# Patient Record
Sex: Male | Born: 1960 | Race: White | Hispanic: No | Marital: Married | Smoking: Former smoker
Health system: Southern US, Community
[De-identification: ages and names within clinical notes are randomized; demographics above are authoritative.]

## PROBLEM LIST (undated history)

## (undated) DIAGNOSIS — F419 Anxiety disorder, unspecified: Secondary | ICD-10-CM

## (undated) DIAGNOSIS — Z9889 Other specified postprocedural states: Secondary | ICD-10-CM

## (undated) DIAGNOSIS — M1990 Unspecified osteoarthritis, unspecified site: Secondary | ICD-10-CM

## (undated) DIAGNOSIS — C439 Malignant melanoma of skin, unspecified: Secondary | ICD-10-CM

## (undated) DIAGNOSIS — T4145XA Adverse effect of unspecified anesthetic, initial encounter: Secondary | ICD-10-CM

## (undated) DIAGNOSIS — Z87898 Personal history of other specified conditions: Secondary | ICD-10-CM

## (undated) DIAGNOSIS — T8859XA Other complications of anesthesia, initial encounter: Secondary | ICD-10-CM

## (undated) DIAGNOSIS — K219 Gastro-esophageal reflux disease without esophagitis: Secondary | ICD-10-CM

## (undated) DIAGNOSIS — Z8603 Personal history of neoplasm of uncertain behavior: Secondary | ICD-10-CM

## (undated) DIAGNOSIS — R569 Unspecified convulsions: Secondary | ICD-10-CM

## (undated) DIAGNOSIS — T7840XA Allergy, unspecified, initial encounter: Secondary | ICD-10-CM

## (undated) HISTORY — DX: Personal history of neoplasm of uncertain behavior: Z86.03

## (undated) HISTORY — DX: Other specified postprocedural states: Z98.890

## (undated) HISTORY — PX: KNEE ARTHROSCOPY: SUR90

## (undated) HISTORY — DX: Allergy, unspecified, initial encounter: T78.40XA

## (undated) HISTORY — DX: Malignant melanoma of skin, unspecified: C43.9

## (undated) HISTORY — DX: Gastro-esophageal reflux disease without esophagitis: K21.9

## (undated) HISTORY — DX: Personal history of other specified conditions: Z87.898

## (undated) HISTORY — DX: Unspecified osteoarthritis, unspecified site: M19.90

## (undated) HISTORY — DX: Anxiety disorder, unspecified: F41.9

## (undated) HISTORY — PX: POLYPECTOMY: SHX149

## (undated) HISTORY — PX: UPPER GASTROINTESTINAL ENDOSCOPY: SHX188

## (undated) HISTORY — PX: COLONOSCOPY: SHX174

---
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
## 1998-07-26 DIAGNOSIS — Z9889 Other specified postprocedural states: Secondary | ICD-10-CM

## 1998-07-26 HISTORY — PX: OTHER SURGICAL HISTORY: SHX169

## 1998-07-26 HISTORY — DX: Other specified postprocedural states: Z98.890

## 2005-08-27 ENCOUNTER — Emergency Department (HOSPITAL_COMMUNITY): Admission: EM | Admit: 2005-08-27 | Discharge: 2005-08-27 | Payer: Self-pay | Admitting: Emergency Medicine

## 2005-08-27 ENCOUNTER — Ambulatory Visit: Payer: Self-pay | Admitting: Internal Medicine

## 2005-08-27 DIAGNOSIS — K222 Esophageal obstruction: Secondary | ICD-10-CM

## 2005-09-15 ENCOUNTER — Ambulatory Visit: Payer: Self-pay | Admitting: Internal Medicine

## 2005-09-20 ENCOUNTER — Encounter (INDEPENDENT_AMBULATORY_CARE_PROVIDER_SITE_OTHER): Payer: Self-pay | Admitting: Specialist

## 2005-09-20 ENCOUNTER — Ambulatory Visit: Payer: Self-pay | Admitting: Internal Medicine

## 2005-09-22 ENCOUNTER — Ambulatory Visit (HOSPITAL_COMMUNITY): Admission: RE | Admit: 2005-09-22 | Discharge: 2005-09-22 | Payer: Self-pay | Admitting: Internal Medicine

## 2007-06-12 ENCOUNTER — Ambulatory Visit: Payer: Self-pay | Admitting: Internal Medicine

## 2007-07-27 DIAGNOSIS — C439 Malignant melanoma of skin, unspecified: Secondary | ICD-10-CM

## 2007-07-27 HISTORY — PX: OTHER SURGICAL HISTORY: SHX169

## 2007-07-27 HISTORY — DX: Malignant melanoma of skin, unspecified: C43.9

## 2007-08-28 ENCOUNTER — Ambulatory Visit (HOSPITAL_COMMUNITY): Admission: RE | Admit: 2007-08-28 | Discharge: 2007-08-28 | Payer: Self-pay | Admitting: General Surgery

## 2007-09-08 DIAGNOSIS — K219 Gastro-esophageal reflux disease without esophagitis: Secondary | ICD-10-CM

## 2007-09-18 ENCOUNTER — Ambulatory Visit (HOSPITAL_BASED_OUTPATIENT_CLINIC_OR_DEPARTMENT_OTHER): Admission: RE | Admit: 2007-09-18 | Discharge: 2007-09-18 | Payer: Self-pay | Admitting: General Surgery

## 2007-09-18 ENCOUNTER — Encounter (INDEPENDENT_AMBULATORY_CARE_PROVIDER_SITE_OTHER): Payer: Self-pay | Admitting: General Surgery

## 2007-10-09 ENCOUNTER — Ambulatory Visit: Payer: Self-pay | Admitting: Hematology and Oncology

## 2007-10-11 ENCOUNTER — Ambulatory Visit (HOSPITAL_COMMUNITY): Admission: RE | Admit: 2007-10-11 | Discharge: 2007-10-11 | Payer: Self-pay | Admitting: Hematology and Oncology

## 2007-10-11 LAB — CBC WITH DIFFERENTIAL/PLATELET
EOS%: 0.7 % (ref 0.0–7.0)
MCH: 31.1 pg (ref 28.0–33.4)
MCHC: 35.4 g/dL (ref 32.0–35.9)
MCV: 87.8 fL (ref 81.6–98.0)
MONO%: 7.5 % (ref 0.0–13.0)
RBC: 4.75 10*6/uL (ref 4.20–5.71)
RDW: 12.1 % (ref 11.2–14.6)

## 2007-10-11 LAB — COMPREHENSIVE METABOLIC PANEL
AST: 21 U/L (ref 0–37)
Albumin: 4.5 g/dL (ref 3.5–5.2)
Alkaline Phosphatase: 52 U/L (ref 39–117)
Potassium: 4 mEq/L (ref 3.5–5.3)
Sodium: 141 mEq/L (ref 135–145)
Total Protein: 6.9 g/dL (ref 6.0–8.3)

## 2007-10-24 ENCOUNTER — Ambulatory Visit (HOSPITAL_COMMUNITY): Admission: RE | Admit: 2007-10-24 | Discharge: 2007-10-24 | Payer: Self-pay | Admitting: Hematology and Oncology

## 2008-09-10 ENCOUNTER — Telehealth: Payer: Self-pay | Admitting: Internal Medicine

## 2008-10-09 ENCOUNTER — Telehealth: Payer: Self-pay | Admitting: Internal Medicine

## 2008-11-08 ENCOUNTER — Ambulatory Visit: Payer: Self-pay | Admitting: Internal Medicine

## 2008-12-10 ENCOUNTER — Telehealth: Payer: Self-pay | Admitting: Internal Medicine

## 2009-09-08 ENCOUNTER — Encounter: Payer: Self-pay | Admitting: Internal Medicine

## 2010-06-25 ENCOUNTER — Encounter (HOSPITAL_COMMUNITY)
Admission: RE | Admit: 2010-06-25 | Discharge: 2010-08-25 | Payer: Self-pay | Source: Home / Self Care | Attending: Family Medicine | Admitting: Family Medicine

## 2010-08-27 NOTE — Medication Information (Signed)
Summary: Approved/medco  Approved/medco   Imported By: Lester Gardena 09/12/2009 10:00:38  _____________________________________________________________________  External Attachment:    Type:   Image     Comment:   External Document

## 2010-12-08 NOTE — Op Note (Signed)
NAMEJOE, GEE               ACCOUNT NO.:  [id number]   MEDICAL RECORD NO.:  [id number]          PATIENT TYPE:  AMB   LOCATION:  DSC                          FACILITY:  [facility]   PHYSICIAN:  Gabrielle Dare. Janee Morn, M.D.DATE OF BIRTH:  1960-08-05   DATE OF PROCEDURE:  09/18/2007  DATE OF DISCHARGE:  09/18/2007                               OPERATIVE REPORT   PREOPERATIVE DIAGNOSIS:  Melanoma left flank.   POSTOPERATIVE DIAGNOSIS:  Melanoma left flank.   PROCEDURES:  1. Left axillary sentinel lymph node biopsy with blue dye injection.  2. Wide excision melanoma left flank 5 x 12 cm with layered closure.   SURGEON:  Gabrielle Dare. Janee Morn, M.D.   HISTORY OF PRESENT ILLNESS:  Mr. Brittian is a 50 year old gentleman who I  evaluated in the office in regards to a melanoma on his left flank.  Biopsy demonstrated thickness of at least 0.60 mm, however, deep margin  was focally involved by invasive disease.  Therefore, he underwent  preoperative lymph scintigram which demonstrated a sentinel node in his  left axilla.  We are proceeding with left axillary sentinel lymph node  biopsy and then wide excision of his melanoma on his left flank.   PROCEDURE IN DETAIL:  Informed consent was obtained.  The patient's site  was identified.  He received intravenous antibiotics.  He was brought to  the operating room.  General endotracheal anesthesia was administered by  the anesthesia staff.  There was 4 mL of diluted methylene blue injected  cutaneously around his melanoma site and massaged for 4 minutes.  Left  axilla was prepped and draped in sterile fashion.  NeoProbe was used  locating a high signal area in the central lower axilla.  Transverse  incision was made.  Subcutaneous tissues were dissected down entering  the axillary fat.  We stayed away from the long thoracic and  thoracodorsal nerves.  We easily found a blue lymph node with high  signal on the NeoProbe about 580.  This was circumferentially  dissected.  Hemostasis was obtained with Bovie cautery and the lymph node was sent  to pathology as hot blue node.  There may have been other smaller node  next to it that was sent together as one specimen NeoProbe was then used  to explore the wound thoroughly and no other elevated signal was found  whatsoever.  The wound was irrigated.  Marcaine 25% with epinephrine was  injected.  Hemostasis was ensured.  Subcutaneous tissues were  approximated with interrupted 3-0 Vicryl sutures and the skin was closed  with running 4-0 Monocryl subcuticular stitch.  Dermabond was then  applied.  At this point, the patient was turned into prone position.  His back and left flank were prepped and draped in sterile fashion.  An  area around his old melanoma biopsy site was measured out, giving at  least 1.5 cm of circumferential margin.  This made a ellipse along  tissue planes approximately 5 x 12 cm.  The area was infiltrated with  25% Marcaine.  Elliptical incision was made encompassing this wide area  of excision.  The subcutaneous tissues were dissected all the way down  to the underlying fascia and the wide excision was taken off in one  piece.  It was oriented with 2-0 silk for pathology.  The area was  copiously irrigated.  Meticulous hemostasis was ensured.  Subcutaneous  tissues were approximated with interrupted 2-0 Vicryl sutures and the  skin was closed with interrupted 3-0 nylon  cutaneous sutures.  It came together nicely without significant tension  whatsoever.  Sponge, needle and instrument counts were all correct.  A  bulky gauze sterile dressing was applied.  The patient tolerated  procedure well without apparent complication and was taken to the  recovery room in stable condition.      Gabrielle Dare Janee Morn, M.D.  Electronically Signed     BET/MEDQ  D:  09/18/2007  T:  09/18/2007  Job:  [id number]   cc:   L. Lupe Carney, M.D.

## 2010-12-08 NOTE — Assessment & Plan Note (Signed)
[facility]                         GASTROENTEROLOGY OFFICE NOTE   NAME:GLYNNDaequan, Kozma                        MRN:          [id number]  DATE:06/12/2007                            DOB:          [birth year]    PROGRESS NOTE:  Mr. Magnussen is a very nice 51 year old gentleman who has a  history of benign distal esophageal stricture, status post food  impaction in his esophagus in February of 2007 requiring emergency  disimpaction.  Repeat upper endoscopy 2 weeks later showed mild  esophageal stricture which was dilated with several dilators, 16-17 mm  with complete resolution of his dysphagia.  He was initially on  Protonix, but subsequently on Prilosec 200 mg daily.  He has been  noticing some dysphagia, but not severe.  He also has heartburn  substernally and sometimes pain with swallowing.  He does not smoke,  does not drink alcohol, does not take any NSAIDs.   PHYSICAL EXAMINATION:  VITAL SIGNS:  Blood pressure 96/68, pulse 56 and  weight 214 pounds.  GENERAL:  He was alert, oriented, in no distress.  The patient was not  examined today.   IMPRESSION:  A 50 year old white male who has a history of benign distal  esophageal stricture, status post dilatation on September 20, 2005, and  esophageal biopsies did not show evidence of Barrett's esophagus.  He is  having breakthrough symptoms at this time on Prilosec 20 mg daily.   PLAN:  1. Increase Prilosec to 40 mg daily.  2. The patient will notify us when he feels he needs repeat      dilatation.  3. Anti-reflux measures.  4. I will see him in 1 year or earlier if he needs to see [place].     Hedwig Morton. Juanda Chance, MD  Electronically Signed    DMB/MedQ  DD: 06/12/2007  DT: 06/13/2007  Job #: [id number]   cc:   L. Lupe Carney, M.D.

## 2011-09-08 ENCOUNTER — Encounter: Payer: Self-pay | Admitting: Internal Medicine

## 2011-10-06 ENCOUNTER — Other Ambulatory Visit: Payer: Self-pay | Admitting: Dermatology

## 2012-02-04 ENCOUNTER — Encounter: Payer: Self-pay | Admitting: Internal Medicine

## 2012-03-17 ENCOUNTER — Ambulatory Visit (AMBULATORY_SURGERY_CENTER): Payer: 59 | Admitting: *Deleted

## 2012-03-17 ENCOUNTER — Telehealth: Payer: Self-pay | Admitting: *Deleted

## 2012-03-17 ENCOUNTER — Encounter: Payer: Self-pay | Admitting: Internal Medicine

## 2012-03-17 VITALS — Ht 71.0 in | Wt 212.3 lb

## 2012-03-17 DIAGNOSIS — Z1211 Encounter for screening for malignant neoplasm of colon: Secondary | ICD-10-CM

## 2012-03-17 MED ORDER — MOVIPREP 100 G PO SOLR: ORAL | Status: DC

## 2012-03-17 NOTE — Telephone Encounter (Signed)
Pt said that during his last surgery in 2009; he was hard to intubate.  He was told that if he ever was intubated again; he should inform anesthetist.  I talked with John Nulty, CRNA and he says pt should have procedure at the hospital instead of [facility].  Left message on pt's voice mail that he would need to have procedure at hospital.  Also left message that colonoscopy for 9/6 will be cancelled and our office would call him to schedule procedure at hospital.  

## 2012-03-17 NOTE — Progress Notes (Signed)
Pt said that during his last surgery in 2009; he was hard to intubate.  He was told that if he ever was intubated again; he should inform anesthetist.  I talked with Cathlyn Parsons, CRNA and he says pt should have procedure at the hospital instead of [facility].  Left message on pt's voice mail that he would need to have procedure at hospital.  Also left message that colonoscopy for 9/6 will be cancelled and our office would call him to schedule procedure at hospital.

## 2012-03-20 NOTE — Telephone Encounter (Signed)
Patient is difficult to intubate per patient. Procedure needs to be done at hospital. Do you want it on your hospital week?

## 2012-03-24 ENCOUNTER — Other Ambulatory Visit: Payer: Self-pay | Admitting: Internal Medicine

## 2012-03-24 DIAGNOSIS — Z1211 Encounter for screening for malignant neoplasm of colon: Secondary | ICD-10-CM

## 2012-03-24 NOTE — Telephone Encounter (Signed)
OK to schedule for the hospital week. No Propofol, just Versed/Fentanyl

## 2012-03-24 NOTE — Telephone Encounter (Signed)
Left message for patient to call back. I have scheduled patient for hospital colonoscopy with fentanyl and versed on 05/17/12 @ 8:30 am.

## 2012-03-28 NOTE — Telephone Encounter (Signed)
Gave patient information regarding time, date and location of his scheduled colonoscopy procedure at the hospital. He verbalizes understanding and will adjust instructions given to him on the day of his previsit.

## 2012-03-28 NOTE — Telephone Encounter (Signed)
Left message for patient to call back  

## 2012-03-31 ENCOUNTER — Encounter: Payer: Self-pay | Admitting: Internal Medicine

## 2012-05-17 ENCOUNTER — Ambulatory Visit (HOSPITAL_COMMUNITY)
Admission: RE | Admit: 2012-05-17 | Discharge: 2012-05-17 | Disposition: A | Discharge status: Home / Self Care | Payer: 59 | Source: Ambulatory Visit | Attending: Internal Medicine | Admitting: Internal Medicine

## 2012-05-17 ENCOUNTER — Encounter (HOSPITAL_COMMUNITY)
Admission: RE | Disposition: A | Discharge status: Home / Self Care | Payer: Self-pay | Source: Ambulatory Visit | Attending: Internal Medicine

## 2012-05-17 ENCOUNTER — Encounter (HOSPITAL_COMMUNITY): Payer: Self-pay | Admitting: *Deleted

## 2012-05-17 DIAGNOSIS — D126 Benign neoplasm of colon, unspecified: Secondary | ICD-10-CM | POA: Insufficient documentation

## 2012-05-17 DIAGNOSIS — K219 Gastro-esophageal reflux disease without esophagitis: Secondary | ICD-10-CM | POA: Insufficient documentation

## 2012-05-17 DIAGNOSIS — Z1211 Encounter for screening for malignant neoplasm of colon: Secondary | ICD-10-CM | POA: Insufficient documentation

## 2012-05-17 DIAGNOSIS — Z85820 Personal history of malignant melanoma of skin: Secondary | ICD-10-CM | POA: Insufficient documentation

## 2012-05-17 HISTORY — DX: Other complications of anesthesia, initial encounter: T88.59XA

## 2012-05-17 HISTORY — DX: Adverse effect of unspecified anesthetic, initial encounter: T41.45XA

## 2012-05-17 HISTORY — DX: Unspecified convulsions: R56.9

## 2012-05-17 HISTORY — PX: COLONOSCOPY: SHX5424

## 2012-05-17 SURGERY — COLONOSCOPY
Anesthesia: Moderate Sedation

## 2012-05-17 MED ORDER — FENTANYL CITRATE 0.05 MG/ML IJ SOLN
INTRAMUSCULAR | Status: DC | PRN
  Administered 2012-05-17 (×4): 25 ug via INTRAVENOUS

## 2012-05-17 MED ORDER — DIPHENHYDRAMINE HCL 50 MG/ML IJ SOLN
INTRAMUSCULAR | Status: AC
  Filled 2012-05-17: qty 1

## 2012-05-17 MED ORDER — SODIUM CHLORIDE 0.9 % IV SOLN
INTRAVENOUS | Status: DC
  Administered 2012-05-17: 08:00:00 via INTRAVENOUS

## 2012-05-17 MED ORDER — MIDAZOLAM HCL 10 MG/2ML IJ SOLN
INTRAMUSCULAR | Status: AC
  Filled 2012-05-17: qty 2

## 2012-05-17 MED ORDER — MIDAZOLAM HCL 5 MG/5ML IJ SOLN
INTRAMUSCULAR | Status: DC | PRN
  Administered 2012-05-17 (×4): 2 mg via INTRAVENOUS

## 2012-05-17 MED ORDER — FENTANYL CITRATE 0.05 MG/ML IJ SOLN
INTRAMUSCULAR | Status: AC
  Filled 2012-05-17: qty 2

## 2012-05-17 NOTE — H&P (Signed)
  Rajon Bisig [birth year] MRN [id number]        History of Present Illness:  This is a 51 yo male scheduled for screening colonoscopy. He has a hx of melanoma. We have seen him in the past for GERD- EGD  08/2007- es. Stricture , dilated to 17 mm.   Past Medical History  Diagnosis Date  . Arthritis     knee, lower back  . Melanoma 2009    back  . 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 GERD (gastroesophageal reflux disease)   . History of seizures     not since 2000; does not require meds   Past Surgical History  Procedure Date  . Removal brain tumor 2000  . Knee arthroscopy 1977, 1979, 1995    left  . Melanoma surgery 2009    lower back  . Tonsillectomy 1967    reports that he quit smoking about 18 years ago. He has never used smokeless tobacco. He reports that he drinks about 5.4 ounces of alcohol per week. He reports that he does not use illicit drugs. family history includes Colon polyps in his mother.  There is no history of Colon cancer and Stomach cancer. No Known Allergies      Review of Systems:  The remainder of the 10 point ROS is negative except as outlined in H&P   Physical Exam: General appearance  Well developed, in no distress. Eyes- non icteric. HEENT nontraumatic, normocephalic. Mouth no lesions, tongue papillated, no cheilosis. Neck supple without adenopathy, thyroid not enlarged, no carotid bruits, no JVD. Lungs Clear to auscultation bilaterally. Cor normal S1, normal S2, regular rhythm, no murmur,  quiet precordium. Abdomen: soft, non tender Rectal:not done Extremities no pedal edema. Skin no lesions. Neurological alert and oriented x 3. Psychological normal mood and affect.  Assessment and Plan:  51 yo male  With no risk factors for colon cancer, will undergo screening colonoscopy   05/17/2012 Lina Sar

## 2012-05-17 NOTE — Op Note (Addendum)
[facility] [address] [place], [address]   COLONOSCOPY PROCEDURE REPORT  PATIENT: Frederick Hamilton, Frederick Hamilton  MR#: [id number] BIRTHDATE: 08-01-1960 , 51  yrs. old GENDER: Male ENDOSCOPIST: Hart Carwin, MD REFERRED BY:  Lupe Carney, M.D. ,Dr Dalene Carrow PROCEDURE DATE:  05/17/2012 PROCEDURE:   Colonoscopy with cold biopsy polypectomy ASA CLASS:   Class II INDICATIONS:average risk patient for colon cancer and hx of melanoma. MEDICATIONS: These medications were titrated to patient response per physician's verbal order, Fentanyl-Detailed 100 mcg IV, and Versed-Detailed 8 mg IV  DESCRIPTION OF PROCEDURE:   After the risks and benefits and of the procedure were explained, informed consent was obtained.  A digital rectal exam revealed no abnormalities of the rectum.    The Pentax Ped Colon [id number]  endoscope was introduced through the anus and advanced to the cecum, which was identified by both the appendix and ileocecal valve .  The quality of the prep was good, using MoviPrep .  The instrument was then slowly withdrawn as the colon was fully examined.     COLON FINDINGS: A smooth sessile polyp ranging between 3-54mm in size was found in the ascending colon.  A polypectomy was performed with cold forceps.  The resection was complete and the polyp tissue was completely retrieved.     Retroflexed views revealed no abnormalities.     The scope was then withdrawn from the patient and the procedure completed.  COMPLICATIONS: There were no complications. ENDOSCOPIC IMPRESSION: Sessile polyp ranging between 3-36mm in size was found in the ascending colon; polypectomy was performed with cold forceps  RECOMMENDATIONS: Await pathology results   REPEAT EXAM: for Colonoscopy. in 5-10 years pending polyp results  cc:  _______________________________ eSigned:  Hart Carwin, MD 05/17/2012 9:12 AM

## 2012-05-17 NOTE — Interval H&P Note (Signed)
History and Physical Interval Note:  05/17/2012 7:05 AM  Frederick Hamilton  has presented today for surgery, with the diagnosis of screening colonoscopy  The various methods of treatment have been discussed with the patient and family. After consideration of risks, benefits and other options for treatment, the patient has consented to  Procedure(s) (LRB) with comments: COLONOSCOPY (N/A) as a surgical intervention .  The patient's history has been reviewed, patient examined, no change in status, stable for surgery.  I have reviewed the patient's chart and labs.  Questions were answered to the patient's satisfaction.     Lina Sar

## 2012-05-18 ENCOUNTER — Encounter (HOSPITAL_COMMUNITY): Payer: Self-pay | Admitting: Internal Medicine

## 2012-05-18 ENCOUNTER — Encounter (HOSPITAL_COMMUNITY): Payer: Self-pay

## 2012-05-20 ENCOUNTER — Encounter: Payer: Self-pay | Admitting: Internal Medicine

## 2012-11-15 ENCOUNTER — Other Ambulatory Visit: Payer: Self-pay | Admitting: Dermatology

## 2013-06-16 ENCOUNTER — Telehealth: Payer: Self-pay | Admitting: Family Medicine

## 2013-06-16 NOTE — Telephone Encounter (Signed)
Pt came in today to receive the flu shot °

## 2013-11-12 ENCOUNTER — Other Ambulatory Visit: Payer: Self-pay | Admitting: Dermatology

## 2013-12-18 ENCOUNTER — Other Ambulatory Visit: Payer: Self-pay | Admitting: Dermatology

## 2015-08-21 ENCOUNTER — Other Ambulatory Visit: Payer: Self-pay | Admitting: Oral Surgery

## 2016-10-07 DIAGNOSIS — H40023 Open angle with borderline findings, high risk, bilateral: Secondary | ICD-10-CM | POA: Diagnosis not present

## 2016-12-27 DIAGNOSIS — H40013 Open angle with borderline findings, low risk, bilateral: Secondary | ICD-10-CM | POA: Diagnosis not present

## 2017-02-18 DIAGNOSIS — Z125 Encounter for screening for malignant neoplasm of prostate: Secondary | ICD-10-CM | POA: Diagnosis not present

## 2017-02-18 DIAGNOSIS — R5383 Other fatigue: Secondary | ICD-10-CM | POA: Diagnosis not present

## 2017-02-18 DIAGNOSIS — J309 Allergic rhinitis, unspecified: Secondary | ICD-10-CM | POA: Diagnosis not present

## 2017-02-18 DIAGNOSIS — K219 Gastro-esophageal reflux disease without esophagitis: Secondary | ICD-10-CM | POA: Diagnosis not present

## 2017-03-18 DIAGNOSIS — L820 Inflamed seborrheic keratosis: Secondary | ICD-10-CM | POA: Diagnosis not present

## 2017-03-18 DIAGNOSIS — L821 Other seborrheic keratosis: Secondary | ICD-10-CM | POA: Diagnosis not present

## 2017-03-18 DIAGNOSIS — D485 Neoplasm of uncertain behavior of skin: Secondary | ICD-10-CM | POA: Diagnosis not present

## 2017-03-18 DIAGNOSIS — Z85820 Personal history of malignant melanoma of skin: Secondary | ICD-10-CM | POA: Diagnosis not present

## 2017-03-18 DIAGNOSIS — D2261 Melanocytic nevi of right upper limb, including shoulder: Secondary | ICD-10-CM | POA: Diagnosis not present

## 2017-05-26 ENCOUNTER — Encounter: Payer: Self-pay | Admitting: Gastroenterology

## 2017-06-14 ENCOUNTER — Encounter: Payer: Self-pay | Admitting: Gastroenterology

## 2017-07-06 DIAGNOSIS — H40013 Open angle with borderline findings, low risk, bilateral: Secondary | ICD-10-CM | POA: Diagnosis not present

## 2017-07-13 DIAGNOSIS — R6889 Other general symptoms and signs: Secondary | ICD-10-CM | POA: Diagnosis not present

## 2017-07-13 DIAGNOSIS — R5383 Other fatigue: Secondary | ICD-10-CM | POA: Diagnosis not present

## 2017-07-14 ENCOUNTER — Other Ambulatory Visit: Payer: Self-pay | Admitting: Family Medicine

## 2017-07-14 DIAGNOSIS — R6889 Other general symptoms and signs: Secondary | ICD-10-CM

## 2017-07-29 ENCOUNTER — Ambulatory Visit
Admission: RE | Admit: 2017-07-29 | Discharge: 2017-07-29 | Disposition: A | Discharge status: Home / Self Care | Payer: Commercial Managed Care - PPO | Source: Ambulatory Visit | Attending: Family Medicine | Admitting: Family Medicine

## 2017-07-29 DIAGNOSIS — R413 Other amnesia: Secondary | ICD-10-CM | POA: Diagnosis not present

## 2017-07-29 DIAGNOSIS — R6889 Other general symptoms and signs: Secondary | ICD-10-CM

## 2017-07-29 MED ORDER — GADOBENATE DIMEGLUMINE 529 MG/ML IV SOLN
19.0000 mL | Freq: Once | INTRAVENOUS | Status: AC | PRN
  Administered 2017-07-29: 19 mL via INTRAVENOUS

## 2017-08-03 ENCOUNTER — Ambulatory Visit (AMBULATORY_SURGERY_CENTER): Payer: Self-pay | Admitting: *Deleted

## 2017-08-03 ENCOUNTER — Telehealth: Payer: Self-pay | Admitting: *Deleted

## 2017-08-03 ENCOUNTER — Other Ambulatory Visit: Payer: Self-pay

## 2017-08-03 VITALS — Ht 72.0 in | Wt 230.0 lb

## 2017-08-03 DIAGNOSIS — T884XXA Failed or difficult intubation, initial encounter: Secondary | ICD-10-CM | POA: Insufficient documentation

## 2017-08-03 DIAGNOSIS — Z86010 Personal history of colonic polyps: Secondary | ICD-10-CM

## 2017-08-03 MED ORDER — NA SULFATE-K SULFATE-MG SULF 17.5-3.13-1.6 GM/177ML PO SOLN: 1.0000 | Freq: Once | ORAL | 0 refills | Status: AC

## 2017-08-03 NOTE — Telephone Encounter (Signed)
Dr Loletha Carrow,  This pt was seen in [place] today- he has a hx of difficult intubation- he did receive a letter stating this- his last colon was done at [facility] due to this issue.  He also told me that he has had a change in bowel habits - he has gone from daily regular stools to liquidy frequent stools. He has had some additions/ changes  to his medicines recently.  Do you want him to have an OV or can he be direct to [facility]??  I did complete his PV today , but informed him he will not be able to have his procedure in the [facility]- I cancelled the [place] colon and explained I will call him with your decision.  Thanks for your time,  Lelan Pons

## 2017-08-03 NOTE — Progress Notes (Signed)
No egg or soy allergy known to patient  No issues with past sedation with any surgeries  or procedures, difficult  Intubation- pt was given a letter for this- will complete PV- give blank instructions and informed pt will get call from nurse with new date/ time  No diet pills per patient No home 02 use per patient  No blood thinners per patient  Pt denies issues with constipation  No A fib or A flutter  EMMI video sent to pt's e mail  Pt states he has had a change in bowel movements- having loose liquid stools daily- he has had some medicine changes and questions if this the cause- TE to Dr Loletha Carrow about intubation and BM changes - informed pt may have to have an OV

## 2017-08-04 NOTE — Telephone Encounter (Signed)
Sounds like he should come see me in clinic regarding the bowel habits.

## 2017-08-04 NOTE — Telephone Encounter (Signed)
Spoke with pt- sch OV with Dr Loletha Carrow with pt- for 2-8 Friday 4-pm- pt states needs late appt  Lelan Pons PV

## 2017-08-04 NOTE — Telephone Encounter (Signed)
Attempted to reach pt- left message on cell number that identifies pt by first and last name to return my call   Frederick Hamilton PV

## 2017-08-17 ENCOUNTER — Encounter: Payer: Self-pay | Admitting: Gastroenterology

## 2017-09-02 ENCOUNTER — Encounter: Payer: Self-pay | Admitting: Gastroenterology

## 2017-09-02 ENCOUNTER — Ambulatory Visit: Payer: Commercial Managed Care - PPO | Admitting: Gastroenterology

## 2017-09-02 VITALS — BP 120/70 | HR 84 | Ht 71.0 in | Wt 233.0 lb

## 2017-09-02 DIAGNOSIS — R194 Change in bowel habit: Secondary | ICD-10-CM | POA: Diagnosis not present

## 2017-09-02 DIAGNOSIS — Z86010 Personal history of colonic polyps: Secondary | ICD-10-CM

## 2017-09-02 MED ORDER — PEG-KCL-NACL-NASULF-NA ASC-C 140 G PO SOLR: 140.0000 g | ORAL | 0 refills | Status: AC

## 2017-09-02 NOTE — Progress Notes (Signed)
[facility] Consult Note:  History: Frederick Hamilton 09/02/2017  Referring physician: Alroy Dust, Carlean Jews.Marlou Sa, MD  Reason for consult/chief complaint: change in bowels (watery bowel movements to pasty bowel movements; typically has hard bowel movements; has been occuring for over 1 year) and history difficult intubation   Subjective  HPI:  This is a 57 year old man last seen by Dr. Olevia Perches for a routine colonoscopy in October 2013, a subcentimeter tubular adenoma was removed.  He was recalled for a colonoscopy, but cannot be done in our endoscopy lab because of a history of difficult intubation from a prior surgery.  He was also describing change in bowel habits and thus was brought for an office visit. For the last year or 2 he has gone from a usual bowel pattern of once a week to 1 every day to every other day, but it is sometimes pasty and occasionally loose.  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 [name] is not certain if he had any new meds when this problem started, but perhaps Prozac was instituted at that time after being changed from previous Zoloft.  He has been on Prilosec for many years, long before the change in bowel habits.  ROS:  Review of Systems  Constitutional: Negative for appetite change and unexpected weight change.  HENT: Negative for mouth sores and voice change.   Eyes: Negative for pain and redness.  Respiratory: Negative for cough and shortness of breath.   Cardiovascular: Negative for chest pain and palpitations.  Genitourinary: Negative for dysuria and hematuria.  Musculoskeletal: Negative for arthralgias and myalgias.  Skin: Negative for pallor and rash.  Neurological: Negative for weakness and headaches.  Hematological: Negative for adenopathy.  Psychiatric/Behavioral: The patient is nervous/anxious.    He reports his anxiety is well controlled with Prozac  Past  Medical History: Past Medical History:  Diagnosis Date  . Allergy   . Anxiety   . Arthritis    knee, lower back  . Complication of anesthesia   . Difficult intubation   . GERD (gastroesophageal reflux disease)   . History of seizures    not since 2000; does not require meds  . 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 Melanoma ([place]) 2009   back  . Seizures ([place])    last was feb 2000 at [facility]     Past Surgical History: Past Surgical History:  Procedure Laterality Date  . COLONOSCOPY  05/17/2012   Procedure: COLONOSCOPY;  Surgeon: Lafayette Dragon, MD;  Location: [facility] ENDOSCOPY;  Service: Endoscopy;  Laterality: N/A;  . COLONOSCOPY    . KNEE ARTHROSCOPY  1977, 1979, 1995   left  . melanoma surgery  2009   lower back  . POLYPECTOMY    . removal brain tumor  2000  . TONSILLECTOMY  1967  . UPPER GASTROINTESTINAL ENDOSCOPY       Family History: Family History  Problem Relation Age of Onset  . Colon polyps Mother   . Irritable bowel syndrome Mother   . Cancer - Other Father        initially had ureter cancer; then had cancer of kidney  . Lung cancer Father   . Colon cancer Neg Hx   . Stomach cancer Neg Hx   . Rectal cancer Neg Hx     Social History: Social History   Socioeconomic History  . Marital status:  Married    Spouse name: None  . Number of children: None  . Years of education: None  . Highest education level: None  Social Needs  . Financial resource strain: None  . Food insecurity - worry: None  . Food insecurity - inability: None  . Transportation needs - medical: None  . Transportation needs - non-medical: None  Occupational History  . None  Tobacco Use  . Smoking status: Former Smoker    Last attempt to quit: 03/17/1994    Years since quitting: 23.4  . Smokeless tobacco: Never Used  Substance and Sexual Activity  . Alcohol use: Yes    Alcohol/week: 5.4 oz    Types: 9 Cans of beer per week    Comment: 6 pack  a week   . Drug use: No  . Sexual activity: None  Other Topics Concern  . None  Social History Narrative  . None    Allergies: No Known Allergies  Outpatient Meds: Current Outpatient Medications  Medication Sig Dispense Refill  . ALLEGRA-D ALLERGY & CONGESTION 60-120 MG 12 hr tablet Take 1 tablet by mouth 2 (two) times daily.  6  . buPROPion (WELLBUTRIN XL) 150 MG 24 hr tablet Take 150 mg by mouth every morning.  12  . etodolac (LODINE) 400 MG tablet Take 400 mg by mouth as needed.     Marland Kitchen FLUoxetine (PROZAC) 20 MG capsule Take 2 capsules by mouth once daily  5  . fluticasone (FLONASE) 50 MCG/ACT nasal spray Place 2 sprays into both nostrils daily.  0  . Multiple Vitamin (MULTIVITAMIN) tablet Take 1 tablet by mouth daily.    Marland Kitchen omeprazole (PRILOSEC) 40 MG capsule Take 40 mg by mouth daily.     . tamsulosin (FLOMAX) 0.4 MG CAPS capsule 1 CAPSULE 30 MINUTES AFTER THE SAME MEAL EACH DAY ORALLY  12  . zolpidem (AMBIEN) 10 MG tablet Take 10 mg by mouth at bedtime as needed.     Marland Kitchen PEG-KCl-NaCl-NaSulf-Na Asc-C (PLENVU) 140 g SOLR Take 140 g by mouth as directed. 1 each 0   No current facility-administered medications for this visit.       ___________________________________________________________________ Objective   Exam:  BP 120/70   Pulse 84   Ht 5\' 11"  (1.803 m)   Wt 233 lb (105.7 kg)   BMI 32.50 kg/m    General: this is a(n) well-appearing man  Eyes: sclera anicteric, no redness  ENT: oral mucosa moist without lesions, no cervical or supraclavicular lymphadenopathy, good dentition  CV: RRR without murmur, S1/S2, no JVD, no peripheral edema  Resp: clear to auscultation bilaterally, normal RR and effort noted  GI: soft, no tenderness, with active bowel sounds. No guarding or palpable organomegaly noted.  Skin; warm and dry, no rash or jaundice noted   Assessment: Encounter Diagnoses  Name Primary?  . Personal history of colonic polyps Yes  . Change in bowel  habits    Because of altered bowel habits unclear, but sounds likely to be benign.   Plan:  I advised daily fiber supplement, and he is scheduled for surveillance colonoscopy in the hospital endoscopy lab.  The benefits and risks of the planned procedure were described in detail with the patient or (when appropriate) their health care proxy.  Risks were outlined as including, but not limited to, bleeding, infection, perforation, adverse medication reaction leading to cardiac or pulmonary decompensation, or pancreatitis (if ERCP).  The limitation of incomplete mucosal visualization was also discussed.  No guarantees or warranties  were given.   Thank you for the courtesy of this consult.  Please call me with any questions or concerns.  [name]  CC: Alroy Dust, L.Marlou Sa, MD

## 2017-09-02 NOTE — Patient Instructions (Signed)
If you are age 56 or older, your body mass index should be between 23-30. Your Body mass index is 32.5 kg/m. If this is out of the aforementioned range listed, please consider follow up with your Primary Care Provider.  If you are age 66 or younger, your body mass index should be between 19-25. Your Body mass index is 32.5 kg/m. If this is out of the aformentioned range listed, please consider follow up with your Primary Care Provider.   You have been scheduled for a colonoscopy. Please follow written instructions given to you at your visit today.  Please pick up your prep supplies at the pharmacy within the next 1-3 days. If you use inhalers (even only as needed), please bring them with you on the day of your procedure. Your physician has requested that you go to www.startemmi.com and enter the access code given to you at your visit today. This web site gives a general overview about your procedure. However, you should still follow specific instructions given to you by our office regarding your preparation for the procedure.  Thank you for choosing [facility]  Dr Wilfrid Lund III

## 2017-09-06 DIAGNOSIS — R0681 Apnea, not elsewhere classified: Secondary | ICD-10-CM | POA: Diagnosis not present

## 2017-10-21 ENCOUNTER — Other Ambulatory Visit: Payer: Self-pay

## 2017-10-21 ENCOUNTER — Encounter (HOSPITAL_COMMUNITY): Payer: Self-pay | Admitting: *Deleted

## 2017-10-28 ENCOUNTER — Other Ambulatory Visit: Payer: Self-pay

## 2017-11-01 ENCOUNTER — Ambulatory Visit (HOSPITAL_COMMUNITY)
Admission: RE | Admit: 2017-11-01 | Discharge: 2017-11-01 | Disposition: A | Discharge status: Home / Self Care | Payer: Commercial Managed Care - PPO | Source: Ambulatory Visit | Attending: Gastroenterology | Admitting: Gastroenterology

## 2017-11-01 ENCOUNTER — Encounter (HOSPITAL_COMMUNITY): Payer: Self-pay

## 2017-11-01 ENCOUNTER — Ambulatory Visit (HOSPITAL_COMMUNITY): Payer: Commercial Managed Care - PPO | Admitting: Certified Registered Nurse Anesthetist

## 2017-11-01 ENCOUNTER — Other Ambulatory Visit: Payer: Self-pay

## 2017-11-01 ENCOUNTER — Encounter (HOSPITAL_COMMUNITY)
Admission: RE | Disposition: A | Discharge status: Home / Self Care | Payer: Self-pay | Source: Ambulatory Visit | Attending: Gastroenterology

## 2017-11-01 DIAGNOSIS — Z860100 Personal history of colon polyps, unspecified: Secondary | ICD-10-CM

## 2017-11-01 DIAGNOSIS — Z1211 Encounter for screening for malignant neoplasm of colon: Secondary | ICD-10-CM | POA: Diagnosis not present

## 2017-11-01 DIAGNOSIS — K222 Esophageal obstruction: Secondary | ICD-10-CM | POA: Diagnosis not present

## 2017-11-01 DIAGNOSIS — Z79899 Other long term (current) drug therapy: Secondary | ICD-10-CM | POA: Diagnosis not present

## 2017-11-01 DIAGNOSIS — G40909 Epilepsy, unspecified, not intractable, without status epilepticus: Secondary | ICD-10-CM | POA: Insufficient documentation

## 2017-11-01 DIAGNOSIS — R197 Diarrhea, unspecified: Secondary | ICD-10-CM

## 2017-11-01 DIAGNOSIS — K219 Gastro-esophageal reflux disease without esophagitis: Secondary | ICD-10-CM | POA: Diagnosis not present

## 2017-11-01 DIAGNOSIS — Q438 Other specified congenital malformations of intestine: Secondary | ICD-10-CM | POA: Diagnosis not present

## 2017-11-01 HISTORY — PX: COLONOSCOPY WITH PROPOFOL: SHX5780

## 2017-11-01 SURGERY — COLONOSCOPY WITH PROPOFOL
Anesthesia: Monitor Anesthesia Care

## 2017-11-01 MED ORDER — LACTATED RINGERS IV SOLN
INTRAVENOUS | Status: DC
  Administered 2017-11-01: 1000 mL via INTRAVENOUS

## 2017-11-01 MED ORDER — PROPOFOL 10 MG/ML IV BOLUS
INTRAVENOUS | Status: AC
  Filled 2017-11-01: qty 20

## 2017-11-01 MED ORDER — SODIUM CHLORIDE 0.9 % IV SOLN: INTRAVENOUS | Status: DC

## 2017-11-01 MED ORDER — PROPOFOL 500 MG/50ML IV EMUL
INTRAVENOUS | Status: DC | PRN
  Administered 2017-11-01: 200 ug/kg/min via INTRAVENOUS

## 2017-11-01 MED ORDER — PROPOFOL 10 MG/ML IV BOLUS
INTRAVENOUS | Status: AC
  Filled 2017-11-01: qty 40

## 2017-11-01 MED ORDER — PROPOFOL 10 MG/ML IV BOLUS
INTRAVENOUS | Status: DC | PRN
  Administered 2017-11-01: 40 mg via INTRAVENOUS

## 2017-11-01 MED ORDER — ONDANSETRON HCL 4 MG/2ML IJ SOLN
INTRAMUSCULAR | Status: DC | PRN
  Administered 2017-11-01: 4 mg via INTRAVENOUS

## 2017-11-01 SURGICAL SUPPLY — 22 items

## 2017-11-01 NOTE — H&P (Signed)
History:  This patient presents for endoscopic testing for history of colon polyp.  Bing Neighbors Referring physician: Alroy Dust, L.Marlou Sa, MD  Past Medical History: Past Medical History:  Diagnosis Date  . Allergy   . Anxiety   . Arthritis    knee, lower back  . Complication of anesthesia   . Difficult intubation   . GERD (gastroesophageal reflux disease)   . History of seizures    not since 2000; does not require meds  . 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 Melanoma ([place]) 2009   back  . Seizures ([place])    last was feb [facility]     Past Surgical History: Past Surgical History:  Procedure Laterality Date  . COLONOSCOPY  05/17/2012   Procedure: COLONOSCOPY;  Surgeon: Lafayette Dragon, MD;  Location: [facility];  Service: Endoscopy;  Laterality: N/A;  . COLONOSCOPY    . KNEE ARTHROSCOPY  1977, 1979, 1995   left  . melanoma surgery  2009   lower back  . POLYPECTOMY    . removal brain tumor  2000  . TONSILLECTOMY  1967  . UPPER GASTROINTESTINAL ENDOSCOPY      Allergies: No Known Allergies  Outpatient Meds: Current Facility-Administered Medications  Medication Dose Route Frequency Provider Last Rate Last Dose  . 0.9 %  sodium chloride infusion   Intravenous Continuous Danis, Estill Cotta III, MD      . lactated ringers infusion   Intravenous Continuous [name], MD 10 mL/hr at 11/01/17 0812 1,000 mL at 11/01/17 4801      ___________________________________________________________________ Objective   Exam:  BP (!) 146/91   Pulse 71   Temp 98.8 F (37.1 C) (Oral)   Resp 12   Ht 5\' 11"  (1.803 m)   Wt 233 lb (105.7 kg)   SpO2 98%   BMI 32.50 kg/m    CV: RRR without murmur, S1/S2, no JVD, no peripheral edema  Resp: clear to auscultation bilaterally, normal RR and effort noted  GI: soft, no tenderness, with active bowel sounds. No guarding or palpable organomegaly noted.  Neuro: awake, alert and oriented x  3. Normal gross motor function and fluent speech   Assessment:  Hx colon polyp Intermittent diarrhea  Plan:  colonoscopy   [name]

## 2017-11-01 NOTE — Discharge Instructions (Signed)
YOU HAD AN ENDOSCOPIC PROCEDURE TODAY: Refer to the procedure report and other information in the discharge instructions given to you for any specific questions about what was found during the examination. If this information does not answer your questions, please call [facility] office at [phone] to clarify.  ° °YOU SHOULD EXPECT: Some feelings of bloating in the abdomen. Passage of more gas than usual. Walking can help get rid of the air that was put into your GI tract during the procedure and reduce the bloating. If you had a lower endoscopy (such as a colonoscopy or flexible sigmoidoscopy) you may notice spotting of blood in your stool or on the toilet paper. Some abdominal soreness may be present for a day or two, also. ° °DIET: Your first meal following the procedure should be a light meal and then it is ok to progress to your normal diet. A half-sandwich or bowl of soup is an example of a good first meal. Heavy or fried foods are harder to digest and may make you feel nauseous or bloated. Drink plenty of fluids but you should avoid alcoholic beverages for 24 hours. If you had a esophageal dilation, please see attached instructions for diet.   ° °ACTIVITY: Your care partner should take you home directly after the procedure. You should plan to take it easy, moving slowly for the rest of the day. You can resume normal activity the day after the procedure however YOU SHOULD NOT DRIVE, use power tools, machinery or perform tasks that involve climbing or major physical exertion for 24 hours (because of the sedation medicines used during the test).  ° °SYMPTOMS TO REPORT IMMEDIATELY: °A gastroenterologist can be reached at any hour. Please call [phone]  for any of the following symptoms:  °Following lower endoscopy (colonoscopy, flexible sigmoidoscopy) °Excessive amounts of blood in the stool  °Significant tenderness, worsening of abdominal pains  °Swelling of the abdomen that is new, acute  °Fever of 100° or  higher  °Following upper endoscopy (EGD, EUS, ERCP, esophageal dilation) °Vomiting of blood or coffee ground material  °New, significant abdominal pain  °New, significant chest pain or pain under the shoulder blades  °Painful or persistently difficult swallowing  °New shortness of breath  °Black, tarry-looking or red, bloody stools ° °FOLLOW UP:  °If any biopsies were taken you will be contacted by phone or by letter within the next 1-3 weeks. Call [phone]  if you have not heard about the biopsies in 3 weeks.  °Please also call with any specific questions about appointments or follow up tests. ° °

## 2017-11-01 NOTE — Transfer of Care (Signed)
Immediate Anesthesia Transfer of Care Note  Patient: Frederick Hamilton  Procedure(s) Performed: COLONOSCOPY WITH PROPOFOL (N/A )  Patient Location: PACU  Anesthesia Type:MAC  Level of Consciousness: awake, alert  and oriented  Airway & Oxygen Therapy: Patient Spontanous Breathing and Patient connected to face mask oxygen  Post-op Assessment: Report given to RN and Post -op Vital signs reviewed and stable  Post vital signs: Reviewed and stable  Last Vitals:  Vitals Value Taken Time  BP    Temp    Pulse    Resp    SpO2      Last Pain:  Vitals:   11/01/17 0800  TempSrc: Oral  PainSc: 0-No pain         Complications: No apparent anesthesia complications

## 2017-11-01 NOTE — Anesthesia Preprocedure Evaluation (Signed)
Anesthesia Evaluation  Patient identified by MRN, date of birth, ID band Patient awake    Reviewed: Allergy & Precautions, NPO status , Patient's Chart, lab work & pertinent test results  History of Anesthesia Complications (+) DIFFICULT AIRWAY and history of anesthetic complications  Airway Mallampati: I       Dental no notable dental hx. (+) Teeth Intact   Pulmonary former smoker,    Pulmonary exam normal        Cardiovascular negative cardio ROS Normal cardiovascular exam Rhythm:Regular Rate:Normal     Neuro/Psych PSYCHIATRIC DISORDERS Anxiety    GI/Hepatic Neg liver ROS, GERD  Medicated,  Endo/Other  negative endocrine ROS  Renal/GU negative Renal ROS  negative genitourinary   Musculoskeletal   Abdominal Normal abdominal exam  (+) + obese,   Peds  Hematology negative hematology ROS (+)   Anesthesia Other Findings   Reproductive/Obstetrics                             Anesthesia Physical Anesthesia Plan  ASA: II  Anesthesia Plan: MAC   Post-op Pain Management:    Induction:   PONV Risk Score and Plan: 1 and Ondansetron  Airway Management Planned: Natural Airway and Simple Face Mask  Additional Equipment:   Intra-op Plan:   Post-operative Plan:   Informed Consent: I have reviewed the patients History and Physical, chart, labs and discussed the procedure including the risks, benefits and alternatives for the proposed anesthesia with the patient or authorized representative who has indicated his/her understanding and acceptance.   Dental advisory given  Plan Discussed with: CRNA and Surgeon  Anesthesia Plan Comments:         Anesthesia Quick Evaluation

## 2017-11-01 NOTE — Interval H&P Note (Signed)
History and Physical Interval Note:  11/01/2017 8:29 AM  Frederick Hamilton  has presented today for surgery, with the diagnosis of Personal history of colon polyps tt  The various methods of treatment have been discussed with the patient and family. After consideration of risks, benefits and other options for treatment, the patient has consented to  Procedure(s): COLONOSCOPY WITH PROPOFOL (N/A) as a surgical intervention .  The patient's history has been reviewed, patient examined, no change in status, stable for surgery.  I have reviewed the patient's chart and labs.  Questions were answered to the patient's satisfaction.     [name]

## 2017-11-01 NOTE — Op Note (Signed)
[facility] Patient Name: Frederick Hamilton Procedure Date: 11/01/2017 MRN: [id number] Attending MD: Estill Cotta. Loletha Carrow , MD Date of Birth: July 26, 1961 CSN: [id number] Age: 57 Admit Type: Outpatient Procedure:                Colonoscopy Indications:              Surveillance: Personal history of adenomatous                            polyps on last colonoscopy > 5 years ago (TA < 73m                            04/2012) Providers:                HEstill Cotta DLoletha Carrow MD, PCleda Daub RN, JCherylynn Ridges Technician, LVirgia Land CRNA Referring MD:             MDonnie Coffin MD Medicines:                Monitored Anesthesia Care Complications:            No immediate complications. Estimated Blood Loss:     Estimated blood loss was minimal. Procedure:                Pre-Anesthesia Assessment:                           - Prior to the procedure, a History and Physical                            was performed, and patient medications and                            allergies were reviewed. The patient's tolerance of                            previous anesthesia was also reviewed. The risks                            and benefits of the procedure and the sedation                            options and risks were discussed with the patient.                            All questions were answered, and informed consent                            was obtained. Prior Anticoagulants: The patient has                            taken no previous anticoagulant or antiplatelet  agents. ASA Grade Assessment: II - A patient with                            mild systemic disease. After reviewing the risks                            and benefits, the patient was deemed in                            satisfactory condition to undergo the procedure.                           After obtaining informed consent, the colonoscope                            was  passed under direct vision. Throughout the                            procedure, the patient's blood pressure, pulse, and                            oxygen saturations were monitored continuously. The                            EC-[id number] ([id number]) scope was introduced through                            the anus and advanced to the the cecum, identified                            by appendiceal orifice and ileocecal valve. The                            colonoscopy was performed with moderate difficulty                            due to a redundant colon. Successful completion of                            the procedure was aided by changing the patient to                            a supine position and using manual pressure. The                            patient tolerated the procedure well. The quality                            of the bowel preparation was excellent. The                            ileocecal valve, appendiceal orifice, and rectum  were photographed. The terminal ileum could not be                            intubated due to scope looping. Scope In: 8:38:51 AM Scope Out: 9:03:05 AM Scope Withdrawal Time: 0 hours 11 minutes 12 seconds  Total Procedure Duration: 0 hours 24 minutes 14 seconds  Findings:      The perianal and digital rectal examinations were normal.      The colon (entire examined portion) was significantly redundant.      Normal mucosa was found in the entire colon. Due to the patient's       reported intermittent diarrhea, biopsies for histology were taken with a       cold forceps from the right colon and left colon for evaluation of       microscopic colitis.      The exam was otherwise without abnormality on direct and retroflexion       views. Impression:               - Redundant colon.                           - Normal mucosa in the entire examined colon.                            Biopsied.                           -  The examination was otherwise normal on direct                            and retroflexion views. Moderate Sedation:      MAC sedation used Recommendation:           - Patient has a contact number available for                            emergencies. The signs and symptoms of potential                            delayed complications were discussed with the                            patient. Return to normal activities tomorrow.                            Written discharge instructions were provided to the                            patient.                           - Resume previous diet.                           - Continue present medications.                           - Await  pathology results.                           - Repeat colonoscopy in 10 years for screening                            purposes. Procedure Code(s):        --- Professional ---                           [phone], Colonoscopy, flexible; with biopsy, single                            or multiple Diagnosis Code(s):        --- Professional ---                           Z86.010, Personal history of colonic polyps CPT copyright 2017 [employer]. All rights reserved. The codes documented in this report are preliminary and upon coder review may  be revised to meet current compliance requirements. Henry L. Loletha Carrow, MD 11/01/2017 9:11:49 AM This report has been signed electronically. Number of Addenda: 0

## 2017-11-01 NOTE — Anesthesia Postprocedure Evaluation (Signed)
Anesthesia Post Note  Patient: Frederick Hamilton  Procedure(s) Performed: COLONOSCOPY WITH PROPOFOL (N/A )     Patient location during evaluation: PACU Anesthesia Type: MAC Level of consciousness: awake Pain management: pain level controlled Vital Signs Assessment: post-procedure vital signs reviewed and stable Respiratory status: spontaneous breathing Cardiovascular status: stable Postop Assessment: no apparent nausea or vomiting Anesthetic complications: no    Last Vitals:  Vitals:   11/01/17 0925 11/01/17 0930  BP: 116/70 116/74  Pulse: 72 66  Resp: 13 16  Temp:    SpO2: 97% 99%    Last Pain:  Vitals:   11/01/17 0925  TempSrc:   PainSc: 0-No pain   Pain Goal:                 Joei Frangos JR,JOHN Leory Allinson

## 2017-11-02 ENCOUNTER — Encounter (HOSPITAL_COMMUNITY): Payer: Self-pay | Admitting: Gastroenterology

## 2018-01-06 DIAGNOSIS — H40013 Open angle with borderline findings, low risk, bilateral: Secondary | ICD-10-CM | POA: Diagnosis not present

## 2018-02-21 DIAGNOSIS — Z13220 Encounter for screening for lipoid disorders: Secondary | ICD-10-CM | POA: Diagnosis not present

## 2018-02-21 DIAGNOSIS — J309 Allergic rhinitis, unspecified: Secondary | ICD-10-CM | POA: Diagnosis not present

## 2018-02-21 DIAGNOSIS — K219 Gastro-esophageal reflux disease without esophagitis: Secondary | ICD-10-CM | POA: Diagnosis not present

## 2018-02-21 DIAGNOSIS — Z125 Encounter for screening for malignant neoplasm of prostate: Secondary | ICD-10-CM | POA: Diagnosis not present

## 2018-04-10 DIAGNOSIS — J069 Acute upper respiratory infection, unspecified: Secondary | ICD-10-CM | POA: Diagnosis not present

## 2018-05-05 DIAGNOSIS — D1801 Hemangioma of skin and subcutaneous tissue: Secondary | ICD-10-CM | POA: Diagnosis not present

## 2018-05-05 DIAGNOSIS — L738 Other specified follicular disorders: Secondary | ICD-10-CM | POA: Diagnosis not present

## 2018-05-05 DIAGNOSIS — L821 Other seborrheic keratosis: Secondary | ICD-10-CM | POA: Diagnosis not present

## 2018-05-05 DIAGNOSIS — Z85820 Personal history of malignant melanoma of skin: Secondary | ICD-10-CM | POA: Diagnosis not present

## 2018-05-05 DIAGNOSIS — D225 Melanocytic nevi of trunk: Secondary | ICD-10-CM | POA: Diagnosis not present

## 2018-05-05 DIAGNOSIS — D485 Neoplasm of uncertain behavior of skin: Secondary | ICD-10-CM | POA: Diagnosis not present

## 2018-07-11 DIAGNOSIS — H40013 Open angle with borderline findings, low risk, bilateral: Secondary | ICD-10-CM | POA: Diagnosis not present

## 2019-11-01 IMAGING — MR MR HEAD WO/W CM
13 series · 48 of 48 positions shown · IV contrast (multihance)
Comparison: PET CT 10/24/2007

CLINICAL DATA: Memory loss and fatigue. History of tumor resection
in 4774.

EXAM:
MRI HEAD WITHOUT AND WITH CONTRAST
TECHNIQUE: Multiplanar, multiecho pulse sequences of the brain and surrounding
structures were obtained without and with intravenous contrast.
CONTRAST:  19mL MULTIHANCE GADOBENATE DIMEGLUMINE 529 MG/ML IV SOLN

[Series 5: T1 · sagittal · 4.0mm · 0.75mm/px · 1 of 33 slices shown (1 of 3)]
[im 1/33]
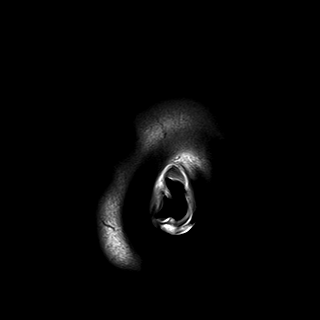

[Series 6: DWI · axial · 3.0mm · 1.44mm/px · z∈[-29,+136]mm · 6 of 102 slices shown (1 of 4)]
[im 1/102]
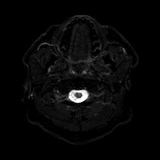
[im 21/102]
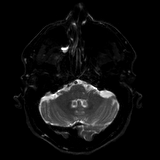
[im 41/102]
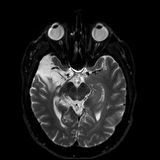
[im 61/102]
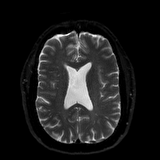
[im 81/102]
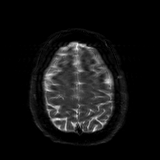
[im 102/102]
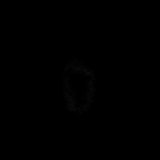

[Series 7: DWI · axial · 3.0mm · 1.44mm/px · z∈[-29,+136]mm · 3 of 46 slices shown (2 of 4)]
[im 1/46]
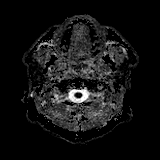
[im 23/46]
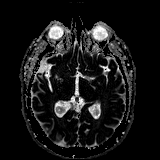
[im 46/46]
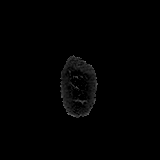

[Series 8: DWI · coronal · 5.0mm · 1.44mm/px · 4 of 68 slices shown (3 of 4)]
[im 1/68]
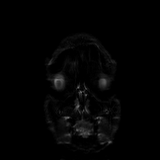
[im 23/68]
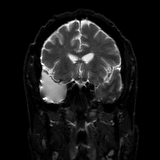
[im 45/68]
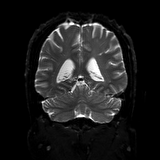
[im 68/68]
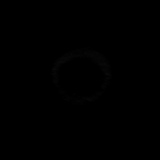

[Series 9: DWI · coronal · 5.0mm · 1.44mm/px · 2 of 34 slices shown (4 of 4)]
[im 1/34]
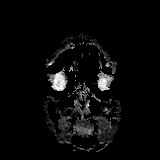
[im 34/34]
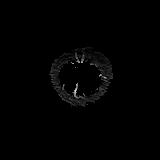

[Series 10: T2 · axial · 4.0mm · 0.36mm/px · z∈[-39,+131]mm · 2 of 34 slices shown (1 of 2)]
[im 1/34]
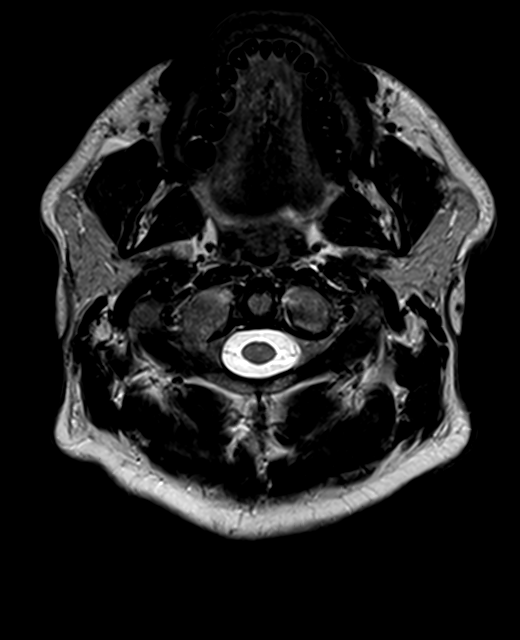
[im 34/34]
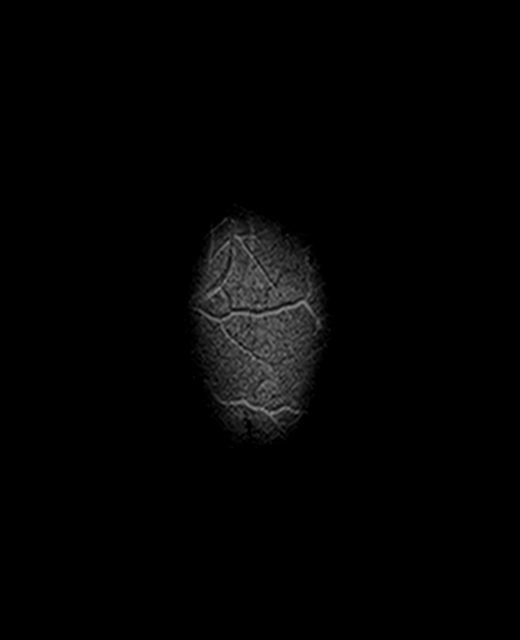

[Series 11: FLAIR · axial · 3.0mm · 0.72mm/px · z∈[-28,+121]mm · 2 of 26 slices shown]
[im 1/26]
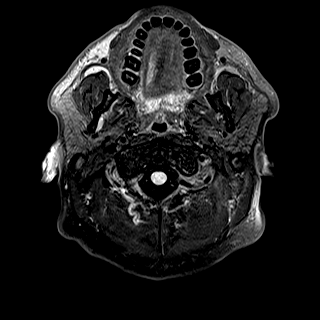
[im 26/26]
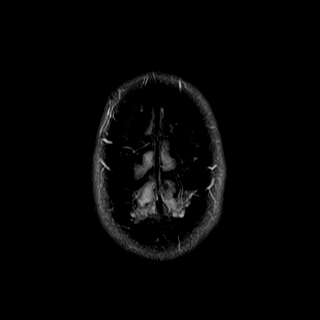

[Series 13: swi_images · axial · 1.5mm · 0.90mm/px · z∈[-25,+117]mm · 6 of 96 slices shown]
[im 1/96]
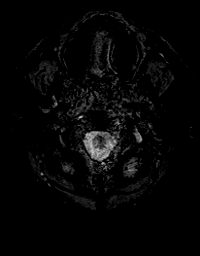
[im 20/96]
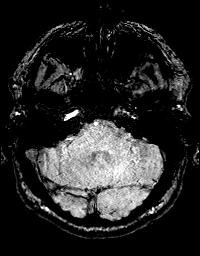
[im 39/96]
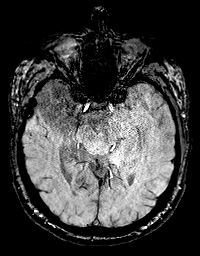
[im 58/96]
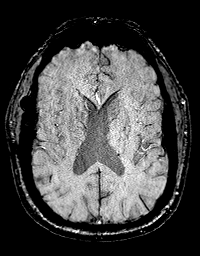
[im 77/96]
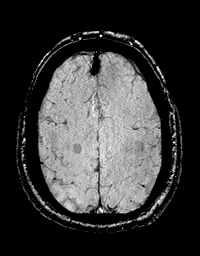
[im 96/96]
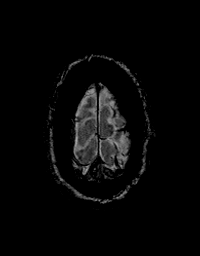

[Series 14: T1 · axial · 1.0mm · 0.90mm/px · z∈[-14,+129]mm · 8 of 144 slices shown (2 of 3)]
[im 1/144]
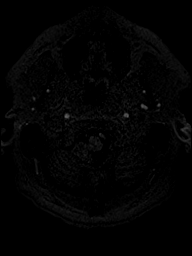
[im 21/144]
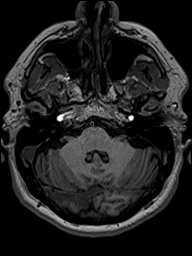
[im 41/144]
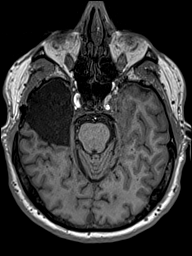
[im 62/144]
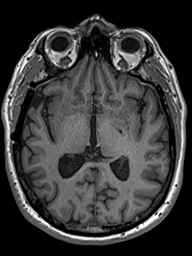
[im 82/144]
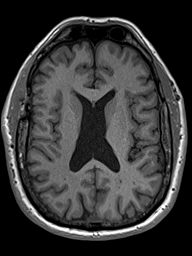
[im 103/144]
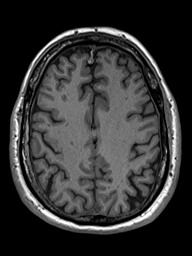
[im 123/144]
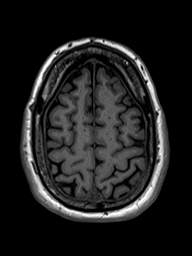
[im 144/144]
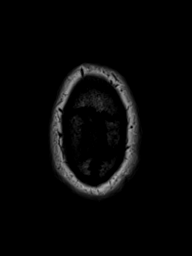

[Series 16: T2 · coronal · 4.5mm · 0.36mm/px · 2 of 32 slices shown (2 of 2)]
[im 1/32]
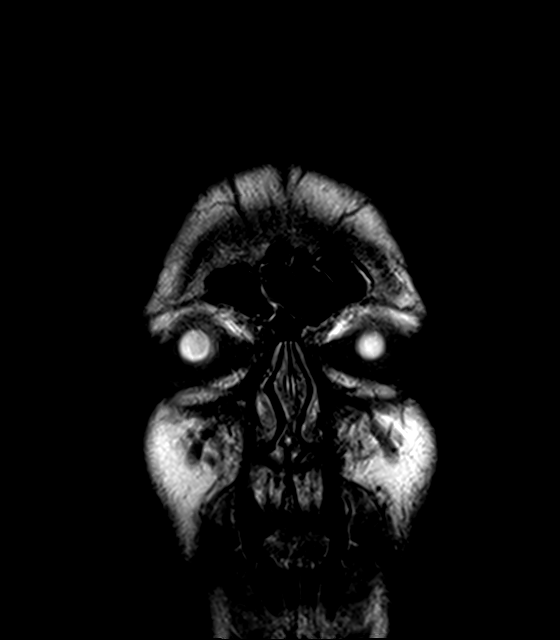
[im 32/32]
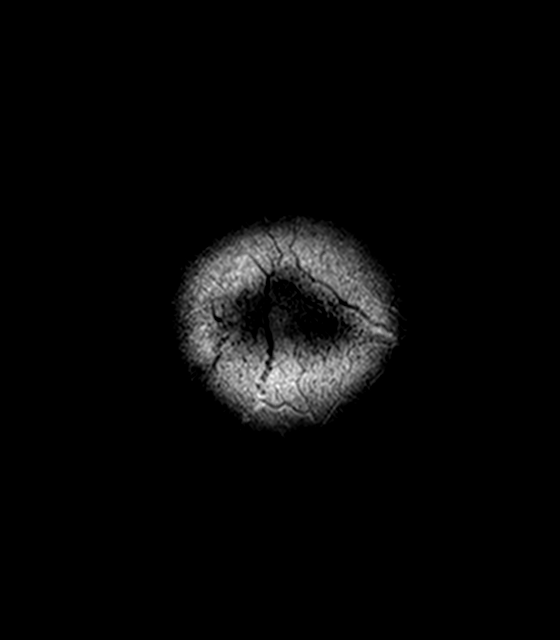

[Series 17: T1 · axial · 1.0mm · 0.90mm/px · z∈[+6,+147]mm · 8 of 144 slices shown (3 of 3)]
[im 1/144]
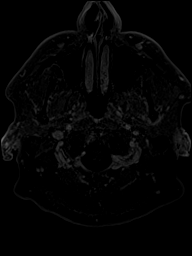
[im 21/144]
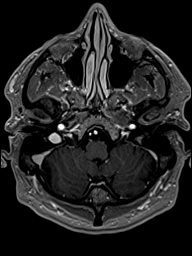
[im 41/144]
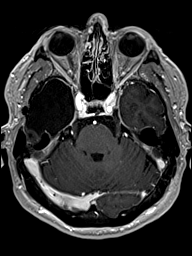
[im 62/144]
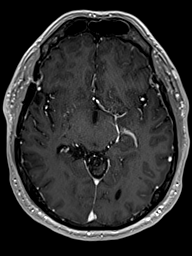
[im 82/144]
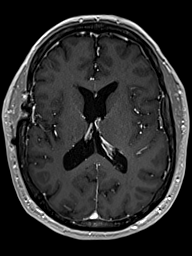
[im 103/144]
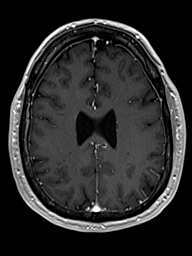
[im 123/144]
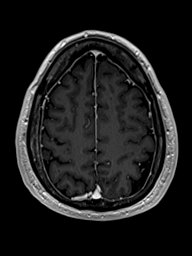
[im 144/144]
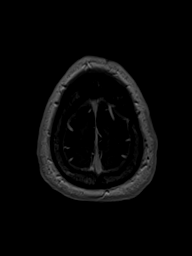

[Series 18: T1 post-contrast · coronal · 4.5mm · 0.72mm/px · 2 of 32 slices shown (1 of 2)]
[im 1/32]
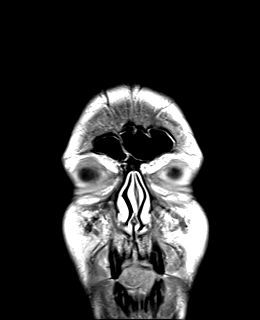
[im 32/32]
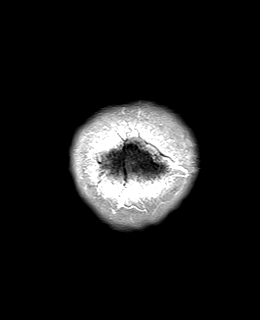

[Series 19: T1 post-contrast · sagittal · 4.0mm · 0.75mm/px · 2 of 31 slices shown (2 of 2)]
[im 1/31]
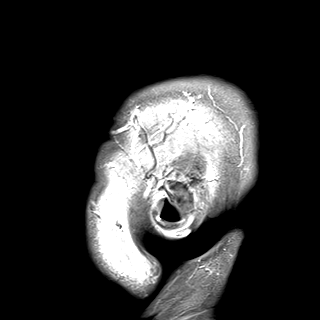
[im 31/31]
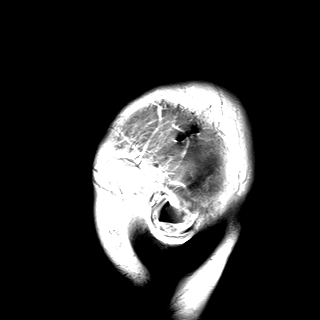

[48 of 48 positions shown; findings below may reference images not displayed]

FINDINGS: Brain: Right temporal lobe resection. FLAIR hyperintensity at the
margins is likely gliosis. No masslike features or abnormal
enhancement.

There is absence of the septum pellucidum. The corpus callosum is
formed and there is no discrete cortical dysplasia. The optic nerves
and chiasm has a normal bulk. Pituitary and sella is small, as is
the infundibulum.

No infarct, hemorrhage, or hydrocephalus.

Cystic intensity space in the right centrum semiovale is CSF
intensity and simple appearing, likely an incidental neural glial
cyst.

Vascular: Major flow voids and vascular enhancements are preserved.

Skull and upper cervical spine: Right pterional craniotomy is
unremarkable. No evidence of marrow lesion.

Sinuses/Orbits: Negative
IMPRESSION: 1. Remote right temporal lobe resection, reportedly for tumor. No
masslike features at the margins.
2. Absent septum pellucidum with small appearing pituitary and
infundibulum. Please correlate with endocrine history. The optic
nerves and chiasm appear normal size.

## 2020-06-16 ENCOUNTER — Telehealth: Payer: Self-pay

## 2020-06-16 NOTE — Telephone Encounter (Signed)
Spoke with Juliann Pulse from the [facility], she was requesting a letter that stated the patient is a difficult intubation, she needs the letter for the anesthesiologist as patient has a surgery tomorrow. Advised that based on 08/03/2017 pre-visit note, pt did state he had a letter stating that he was a difficult intubation, advised that we did not write the letter. She had no other concerns at the end of the call.

## 2020-08-15 ENCOUNTER — Other Ambulatory Visit: Payer: Commercial Managed Care - PPO

## 2022-08-10 ENCOUNTER — Other Ambulatory Visit: Payer: Self-pay | Admitting: Family Medicine

## 2022-08-10 ENCOUNTER — Ambulatory Visit: Payer: 59 | Attending: Audiology | Admitting: Audiology

## 2022-08-10 DIAGNOSIS — S73191A Other sprain of right hip, initial encounter: Secondary | ICD-10-CM

## 2022-08-10 DIAGNOSIS — H9311 Tinnitus, right ear: Secondary | ICD-10-CM | POA: Insufficient documentation

## 2022-08-10 DIAGNOSIS — H903 Sensorineural hearing loss, bilateral: Secondary | ICD-10-CM | POA: Insufficient documentation

## 2022-08-10 DIAGNOSIS — M25551 Pain in right hip: Secondary | ICD-10-CM

## 2022-08-10 NOTE — Procedures (Signed)
  [facility] [place], [place]  [id number] [phone]  AUDIOLOGICAL  EVALUATION  NAME: Frederick Hamilton     DOB:   17-Nov-1960      MRN: [id number]                                                                                     DATE: 08/10/2022     REFERENT: Alroy Dust, L.Marlou Sa, MD STATUS: Outpatient DIAGNOSIS: sensorineural hearing loss, bilateral    History: [name] was seen for an audiological evaluation due to decreased hearing occurring for many years. 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 [name] reports bilateral tinnitus, worse in the right ear, which he describes as a "bell" sound. [name] reports aural fullness, and itchiness in the ear canals. [name] denies otalgia and dizziness. [name] reports increased difficulty hearing and communicating with his wife. [name] reports he recently underwent cerumen removal.   Evaluation:  Otoscopy showed a clear view of the tympanic membranes, bilaterally Tympanometry results were consistent with normal middle ear pressure and normal tympanic membrane mobility (Type A), bilaterally.  Audiometric testing was completed using Conventional Audiometry techniques with insert earphones and TDH headphones. Test results are consistent with normal hearing sensitivity at [phone] sloping to a moderate to moderately severe sensorineural hearing loss at 3000-8000 Hz. Speech Recognition Thresholds were obtained at 10 dB HL in the right ear and at 10  dB HL in the left ear. Word Recognition Testing was completed at 70 dB HL and [name] scored 92% in the right ear and at 75 dB HL and [name] scored 100% in the left ear.      Results:  The test results were reviewed with [name]. Today's test results are consistent with normal hearing sensitivity at [phone] sloping to a moderate to moderately severe  sensorineural hearing loss at 3000-8000 Hz. [name] will have hearing and communication difficulty in adverse listening environments. He will benefit from the use of good communication strategies and the use of amplification if motivated to use it. [name] was counseled regarding his tinnitus and given handouts on tinnitus management strategies. [name] was given effective communication strategy handouts. [name] was given a list of hearing aid providers in the [place] area.   Recommendations: 1.   Monitor hearing sensitivity. Return in 2 years to continue to monitor hearing sensitivity. 2.   Communication Needs Assessment with an Audiologist to discuss amplification if motivated at this time.      35 minutes spent testing and counseling on results.   If you have any questions please feel free to contact me at [phone].  Bari Mantis Audiologist, Au.D., CCC-A 08/10/2022  4:14 PM  Cc: Alroy Dust, L.Marlou Sa, MD

## 2022-08-11 ENCOUNTER — Encounter: Payer: Self-pay | Admitting: Family Medicine

## 2022-08-12 ENCOUNTER — Ambulatory Visit
Admission: RE | Admit: 2022-08-12 | Discharge: 2022-08-12 | Disposition: A | Discharge status: Home / Self Care | Payer: 59 | Source: Ambulatory Visit | Attending: Family Medicine | Admitting: Family Medicine

## 2022-08-12 DIAGNOSIS — S73191A Other sprain of right hip, initial encounter: Secondary | ICD-10-CM

## 2022-08-12 DIAGNOSIS — M25551 Pain in right hip: Secondary | ICD-10-CM

## 2022-08-15 ENCOUNTER — Other Ambulatory Visit: Payer: 59
